# Patient Record
Sex: Female | Born: 1975 | Race: White | Hispanic: No | Marital: Married | Smoking: Never smoker
Health system: Southern US, Community
[De-identification: ages and names within clinical notes are randomized; demographics above are authoritative.]

---

## 2000-10-13 ENCOUNTER — Inpatient Hospital Stay (HOSPITAL_COMMUNITY): Admission: AD | Admit: 2000-10-13 | Discharge: 2000-10-16 | Payer: Self-pay | Admitting: Obstetrics and Gynecology

## 2000-10-18 ENCOUNTER — Encounter: Admission: RE | Admit: 2000-10-18 | Discharge: 2000-11-17 | Payer: Self-pay | Admitting: Obstetrics and Gynecology

## 2001-10-31 ENCOUNTER — Other Ambulatory Visit: Admission: RE | Admit: 2001-10-31 | Discharge: 2001-10-31 | Payer: Self-pay | Admitting: Obstetrics and Gynecology

## 2002-05-09 ENCOUNTER — Inpatient Hospital Stay (HOSPITAL_COMMUNITY): Admission: AD | Admit: 2002-05-09 | Discharge: 2002-05-11 | Payer: Self-pay | Admitting: Obstetrics and Gynecology

## 2004-07-17 ENCOUNTER — Other Ambulatory Visit: Admission: RE | Admit: 2004-07-17 | Discharge: 2004-07-17 | Payer: Self-pay | Admitting: Obstetrics and Gynecology

## 2005-03-14 ENCOUNTER — Inpatient Hospital Stay (HOSPITAL_COMMUNITY): Admission: AD | Admit: 2005-03-14 | Discharge: 2005-03-16 | Payer: Self-pay | Admitting: Obstetrics and Gynecology

## 2008-03-07 ENCOUNTER — Inpatient Hospital Stay (HOSPITAL_COMMUNITY): Admission: AD | Admit: 2008-03-07 | Discharge: 2008-03-07 | Payer: Self-pay | Admitting: Obstetrics and Gynecology

## 2008-03-17 ENCOUNTER — Inpatient Hospital Stay (HOSPITAL_COMMUNITY): Admission: AD | Admit: 2008-03-17 | Discharge: 2008-03-17 | Payer: Self-pay | Admitting: Obstetrics & Gynecology

## 2008-05-25 ENCOUNTER — Inpatient Hospital Stay (HOSPITAL_COMMUNITY): Admission: AD | Admit: 2008-05-25 | Discharge: 2008-05-27 | Payer: Self-pay | Admitting: Obstetrics & Gynecology

## 2008-05-25 ENCOUNTER — Encounter (INDEPENDENT_AMBULATORY_CARE_PROVIDER_SITE_OTHER): Payer: Self-pay | Admitting: Obstetrics and Gynecology

## 2010-05-20 LAB — CBC
HCT: 35.1 % — ABNORMAL LOW (ref 36.0–46.0)
Hemoglobin: 12 g/dL (ref 12.0–15.0)
MCHC: 34.2 g/dL (ref 30.0–36.0)
MCHC: 34.2 g/dL (ref 30.0–36.0)
MCV: 88.1 fL (ref 78.0–100.0)
Platelets: 233 10*3/uL (ref 150–400)
RBC: 3.32 MIL/uL — ABNORMAL LOW (ref 3.87–5.11)
RDW: 13.6 % (ref 11.5–15.5)

## 2010-05-25 LAB — FETAL FIBRONECTIN: Fetal Fibronectin: NEGATIVE

## 2010-05-25 LAB — URINALYSIS, ROUTINE W REFLEX MICROSCOPIC
Bilirubin Urine: NEGATIVE
Ketones, ur: 15 mg/dL — AB
Nitrite: NEGATIVE
pH: 5.5 (ref 5.0–8.0)

## 2010-05-25 LAB — URINE MICROSCOPIC-ADD ON

## 2010-05-26 LAB — BASIC METABOLIC PANEL
BUN: 5 mg/dL — ABNORMAL LOW (ref 6–23)
CO2: 23 mEq/L (ref 19–32)
Calcium: 8.8 mg/dL (ref 8.4–10.5)
Chloride: 106 mEq/L (ref 96–112)
Creatinine, Ser: 0.5 mg/dL (ref 0.4–1.2)
Glucose, Bld: 97 mg/dL (ref 70–99)

## 2010-05-26 LAB — URINALYSIS, ROUTINE W REFLEX MICROSCOPIC
Nitrite: NEGATIVE
Specific Gravity, Urine: 1.015 (ref 1.005–1.030)
Urobilinogen, UA: 0.2 mg/dL (ref 0.0–1.0)

## 2010-05-26 LAB — CBC
MCHC: 33.6 g/dL (ref 30.0–36.0)
MCV: 91.6 fL (ref 78.0–100.0)
RDW: 13.1 % (ref 11.5–15.5)

## 2010-05-26 LAB — URINE MICROSCOPIC-ADD ON

## 2010-05-26 LAB — FETAL FIBRONECTIN: Fetal Fibronectin: NEGATIVE

## 2010-05-26 LAB — RAPID STREP SCREEN (MED CTR MEBANE ONLY): Streptococcus, Group A Screen (Direct): NEGATIVE

## 2010-06-23 NOTE — Discharge Summary (Signed)
NAMESANTANNA, Sherry Reid                 ACCOUNT NO.:  [id number]   MEDICAL RECORD NO.:  [id number]          PATIENT TYPE:  INP   LOCATION:  9103                          FACILITY:  [facility]   PHYSICIAN:  Gerrit Friends. Aldona Bar, M.D.   DATE OF BIRTH:  09-22-1975   DATE OF ADMISSION:  05/25/2008  DATE OF DISCHARGE:  05/27/2008                               DISCHARGE SUMMARY   DISCHARGE DIAGNOSES:  1. A 37-week intrauterine pregnancy, delivered 7-pound female infant,      Apgars 8 and 9.  2. Blood type O positive.  3. Meconium-stained fluid.   PROCEDURE:  1. Normal spontaneous delivery.  2. Repair of first-degree tear.   SUMMARY:  This 35 year old, gravida 4, now para 4, was admitted at 53  and 34 weeks' gestation with ruptured membranes - meconium-stained  fluid.  Pregnancy was relatively benign.  She progressed fairly rapidly  and had a normal spontaneous delivery of viable female infant with Apgars  of 8 and 9 over first-degree tear which was repaired without difficulty.  Her postpartum course was uncomplicated.  Her discharge hemoglobin was  10 with a white count of 10,100 and platelet count of 233,000.  On the  morning of May 27, 2008, she was ambulating well, tolerating a regular  diet well, having normal bowel and bladder function, was afebrile.  Her  breast-feeding was going well and she was anxious for discharge.  Accordingly, she was given all appropriate instructions and understood  all instructions well.   Discharge medications include vitamins - 1 a day as long as she is  breastfeeding and Feosol capsules - 1 at least 4 times a week.   She will return to the office for followup in approximately 4 weeks'  time or as needed.  She was given a discharge brochure at the time of  discharge and understood all instructions well.   She will use Tylenol or Advil as needed for discomfort.   CONDITION ON DISCHARGE:  Improved.      Gerrit Friends. Aldona Bar, M.D.  Electronically Signed     RMW/MEDQ   D:  05/27/2008  T:  05/27/2008  Job:  [id number]

## 2010-06-26 NOTE — Discharge Summary (Signed)
Sherry Reid, Sherry Reid        ACCOUNT NO.:  [id number]   MEDICAL RECORD NO.:  [id number]          PATIENT TYPE:  INP   LOCATION:  9144                          FACILITY:  [facility]   PHYSICIAN:  Gerrit Friends. Aldona Bar, M.D.   DATE OF BIRTH:  09-Nov-1975   DATE OF ADMISSION:  03/14/2005  DATE OF DISCHARGE:  03/16/2005                                 DISCHARGE SUMMARY   DISCHARGE DIAGNOSES:  1.  Term pregnancy delivered 8 pounds 4 ounces female infant, Apgars 9 and      9.  .  2.  Blood type O+.   PROCEDURES:  1.  Normal spontaneous delivery.  2.  Second-degree tear repair,   HISTORY OF PRESENT ILLNESS:  This 35 year old gravida 3, para 2 with a due  date of March 07, 2005, was admitted in early labor at 40-[redacted] weeks  gestation after an uncomplicated pregnancy.  She progressed slowly but  steadily during her first stage but had a short second stage with subsequent  normal spontaneous delivery of viable female infant weighing 8 pounds 4  ounces with Apgar hours of 99 over a second-degree tear which was repaired  without difficulty.  Her postpartum course was benign.  Her discharge  hemoglobin was 10.3 with a white count of 17,500, platelet count of 304,000.  On the morning of March 16, 2005, she March 16, 2004, she was ambulating  well, tolerating a regular diet well, having normal bowel and bladder  function and was afebrile.  Her breast feeding was going well, and she was  desirous of discharge.  Accordingly, she was given all of appropriate  instructions and understood all instructions well.   DISCHARGE MEDICATIONS:  1.  Vitamins one a day as long she is breast-feeding.  2.  Feosol capsules one 3-4 times a week.  3.  Motrin 600 mg every 6 hours as needed for cramping.  4.  Tylox one to two every 4-6 hours as needed for more severe pain.   DISCHARGE INSTRUCTIONS:  1.  She will return the office followup in approximately four weeks' time or      as needed.  2.  She was given  instruction sheet at time of discharge and understood all      instructions well.   CONDITION ON DISCHARGE:  Improved.      Gerrit Friends. Aldona Bar, M.D.  Electronically Signed     RMW/MEDQ  D:  03/16/2005  T:  03/16/2005  Job:  [id number]

## 2010-12-25 IMAGING — US US RENAL
1 series · 14 of 25 positions shown · non-contrast
Comparison: None

CLINICAL DATA: Left-sided flank pain

RENAL/URINARY TRACT ULTRASOUND
TECHNIQUE: Complete ultrasound examination of the urinary tract
was performed including evaluation of the kidneys, renal collecting
systems, and urinary bladder.

[Series 1: us renal · 14 of 37 slices shown]
[im 1/37]
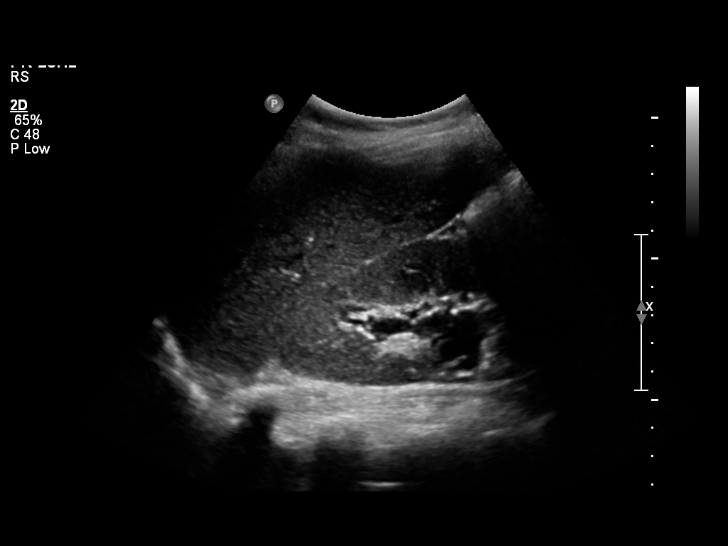
[im 4/37]
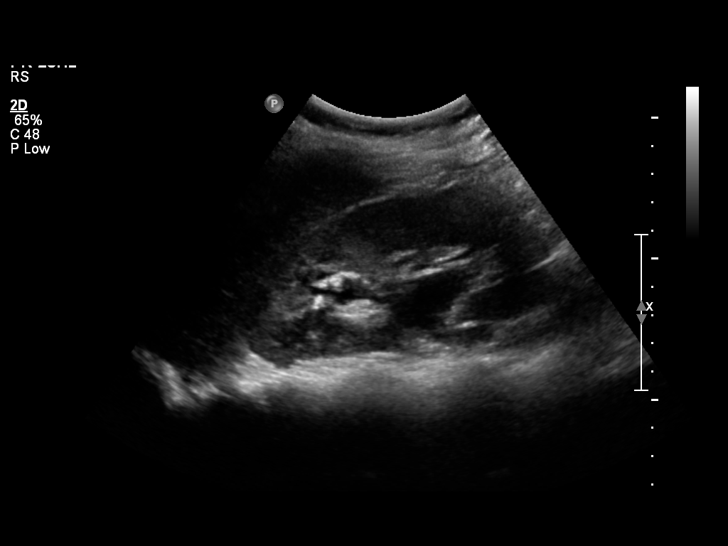
[im 7/37]
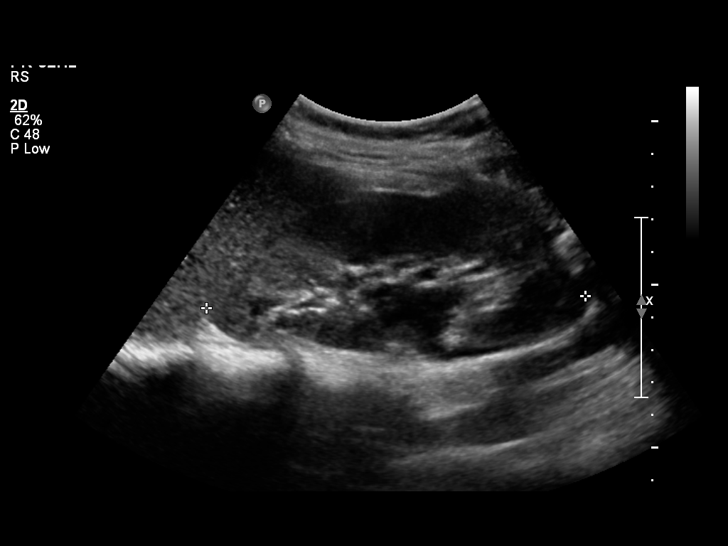
[im 10/37]
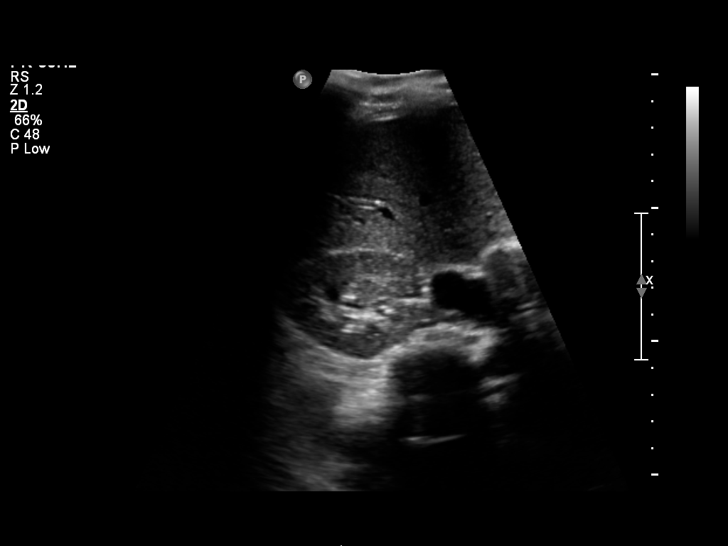
[im 13/37]
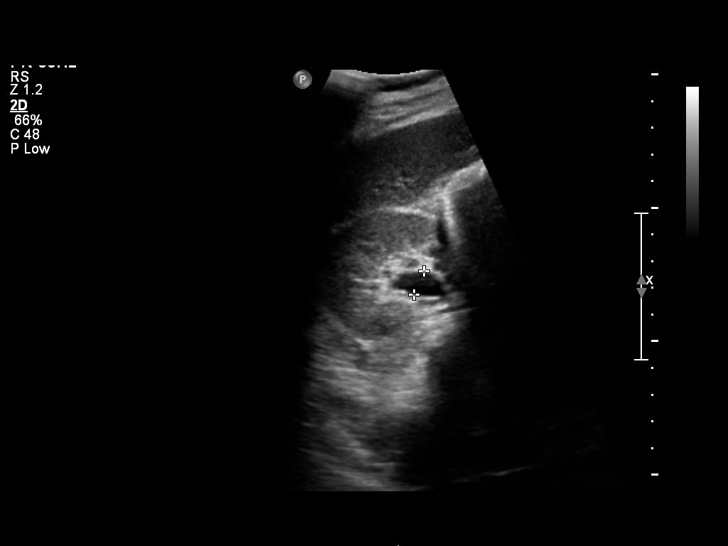
[im 14/37]
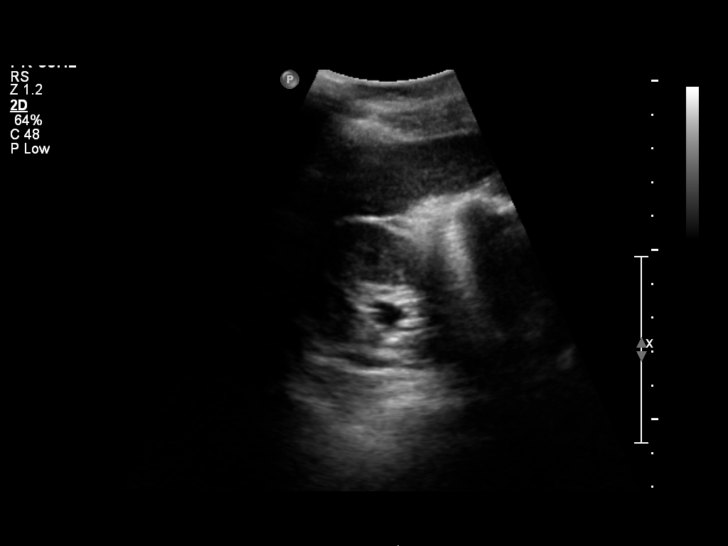
[im 17/37]
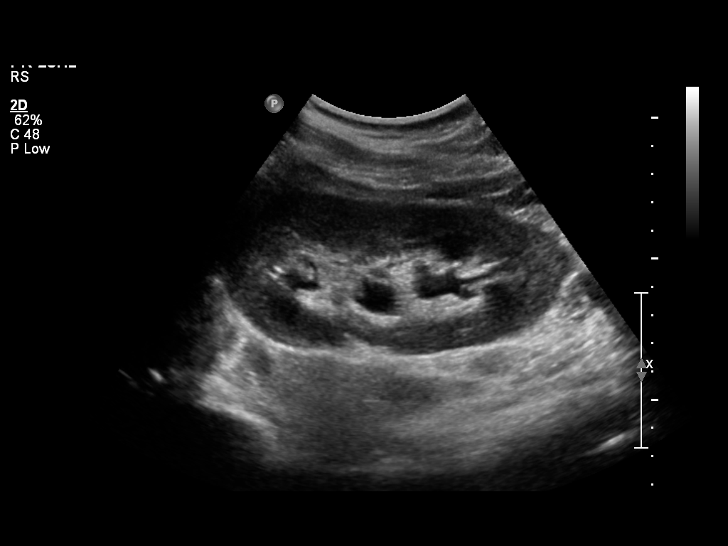
[im 20/37]
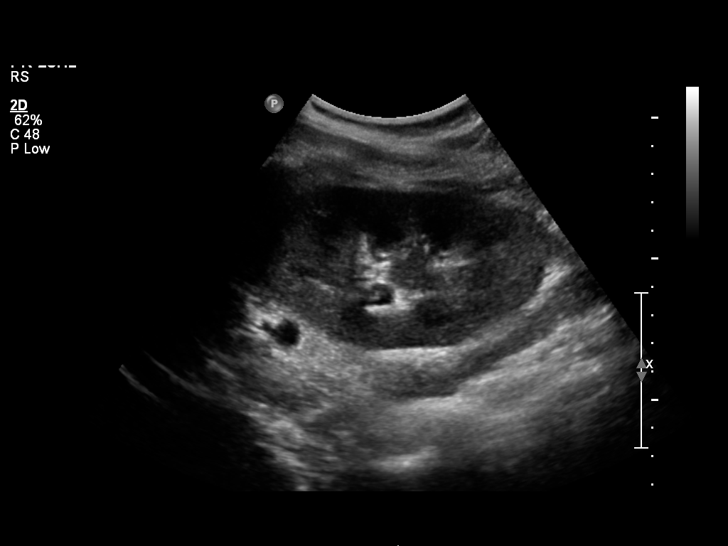
[im 23/37]
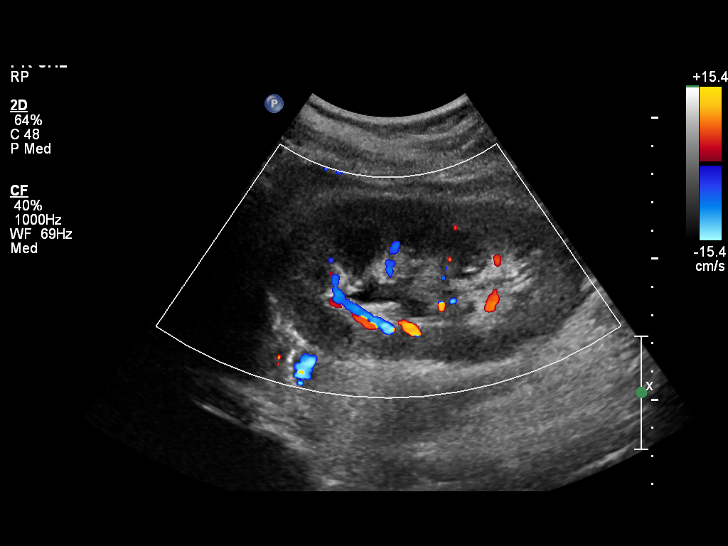
[im 25/37]
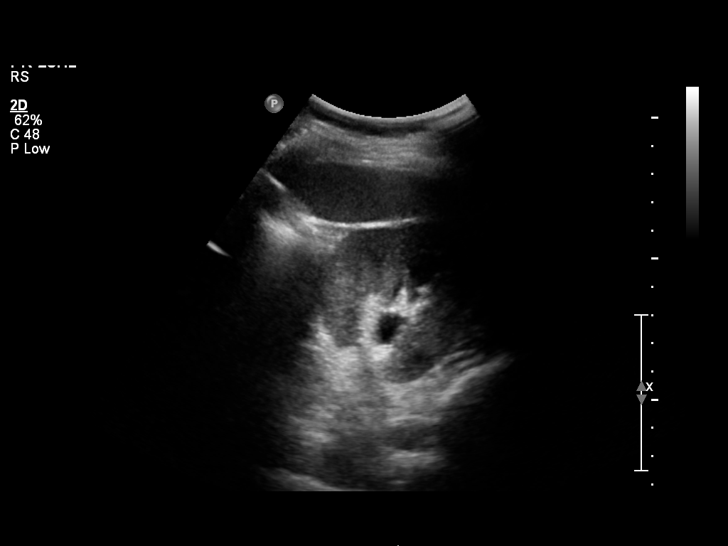
[im 28/37]
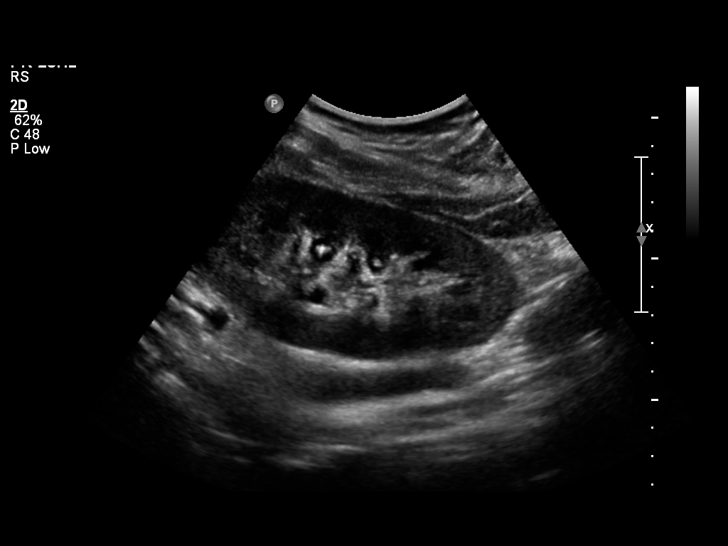
[im 31/37]
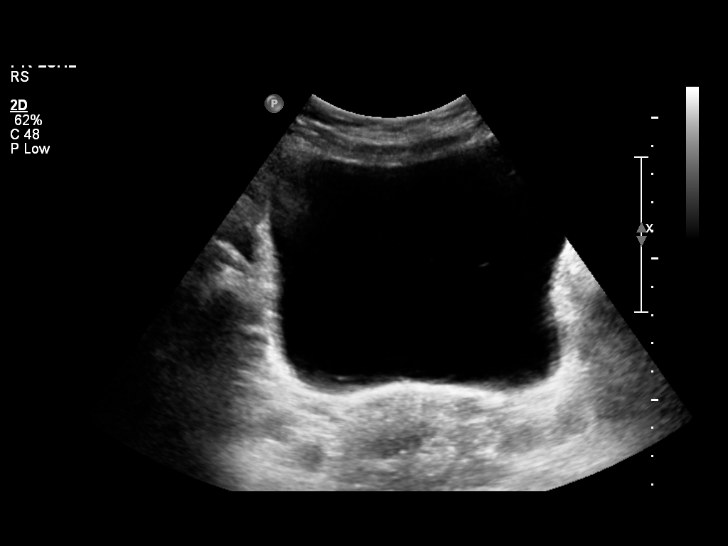
[im 34/37]
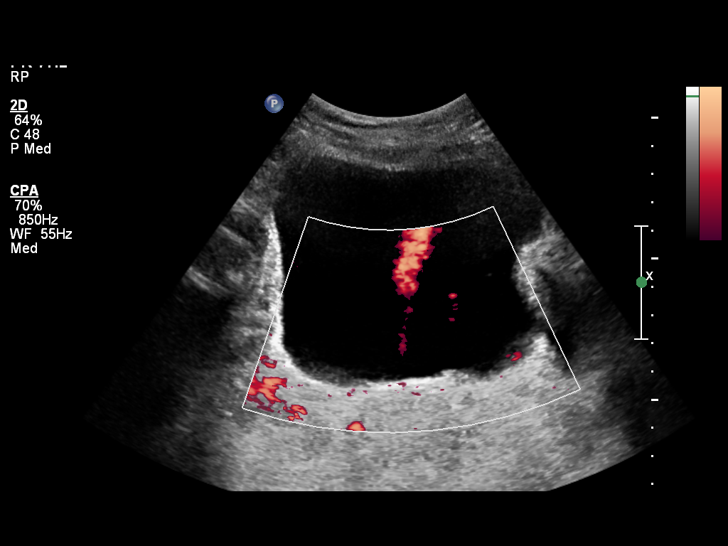
[im 37/37]
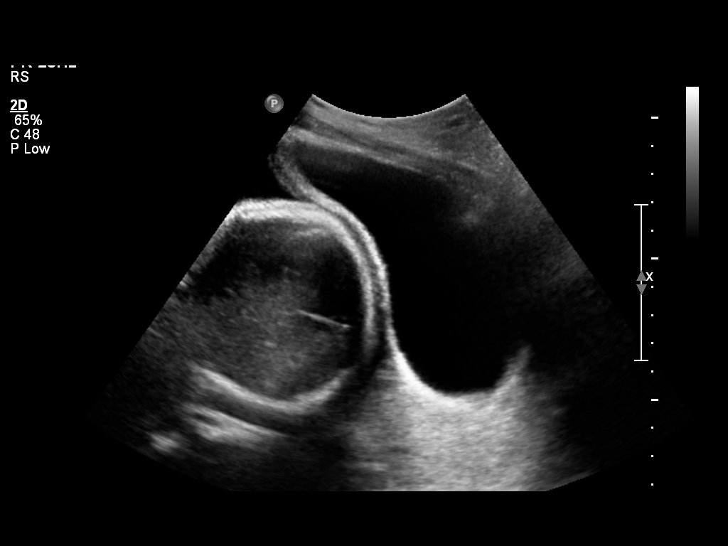

[14 of 25 positions shown; findings below may reference images not displayed]

FINDINGS: Right Kidney:  11.9 cm

Left kidney:  12.5 cm

Mild fullness of the bilateral intrarenal collecting systems is
noted, within normal limits for the patient's pregnant state.

Bilateral ureteral jets are visualized.  The bladder is
unremarkable.
IMPRESSION: Mild fullness of the bilateral intrarenal collecting systems,
within normal limits for the patient's pregnant state.  No acute
abnormality.  No evidence for obstruction.

## 2013-01-30 ENCOUNTER — Other Ambulatory Visit: Payer: Self-pay

## 2013-01-30 ENCOUNTER — Other Ambulatory Visit: Payer: Self-pay | Admitting: Allergy and Immunology

## 2013-01-30 DIAGNOSIS — R05 Cough: Secondary | ICD-10-CM

## 2016-08-30 ENCOUNTER — Encounter: Payer: Self-pay | Admitting: Vascular Surgery

## 2016-08-31 ENCOUNTER — Ambulatory Visit (INDEPENDENT_AMBULATORY_CARE_PROVIDER_SITE_OTHER): Payer: BLUE CROSS/BLUE SHIELD | Admitting: Vascular Surgery

## 2016-08-31 ENCOUNTER — Encounter: Payer: Self-pay | Admitting: Vascular Surgery

## 2016-08-31 VITALS — BP 119/79 | HR 75 | Temp 98.2°F | Resp 18 | Ht 62.0 in | Wt 106.0 lb

## 2016-08-31 DIAGNOSIS — I83891 Varicose veins of right lower extremities with other complications: Secondary | ICD-10-CM | POA: Insufficient documentation

## 2016-08-31 NOTE — Progress Notes (Signed)
Subjective:     Patient ID: Sherry Reid, female   DOB: 1975-04-11, 41 y.o.   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 She has been experiencing aching discomfort in the right posterior thigh particularly at night and she has a quite prominent vein in this area. She has no history of DVT thrombophlebitis stasis ulcers or bleeding. She does not experience swelling in the legs. She is not well elastic compression stockings or elevate her legs. The symptoms seem to have become more prominent recently. She has no symptoms in the left leg.  History reviewed. No pertinent past medical history.  Social History  Substance Use Topics  . Smoking status: Never Smoker  . Smokeless tobacco: Never Used  . Alcohol use Yes     Comment: very occassionally    History reviewed. No pertinent family history.  No Known Allergies   Current Outpatient Prescriptions:  .  diphenhydramine-acetaminophen (TYLENOL PM) 25-500 MG TABS tablet, Take 1 tablet by mouth at bedtime as needed., Disp: , Rfl:   Vitals:   08/31/16 1029  BP: 119/79  Pulse: 75  Resp: 18  Temp: 98.2 F (36.8 C)  TempSrc: Oral  SpO2: 99%  Weight: 106 lb (48.1 kg)  Height: 5\' 2"  (1.575 m)    Body mass index is 19.39 kg/m.         Review of Systems Completely negative review of systems in this healthy lady    Objective:   Physical Exam BP 119/79 (BP Location: Left Arm, Patient Position: Sitting, Cuff Size: Normal)   Pulse 75   Temp 98.2 F (36.8 C) (Oral)   Resp 18   Ht 5\' 2"  (1.575 m)   Wt 106 lb (48.1 kg)   SpO2 99%   BMI 19.39 kg/m     Gen.-alert and oriented x3 in no apparent distress HEENT normal for age Lungs no rhonchi or wheezing Cardiovascular regular rhythm no murmurs carotid pulses 3+ palpable no bruits audible Abdomen soft nontender no palpable masses Musculoskeletal free of  major deformities Skin  clear -no rashes Neurologic normal Lower extremities 3+ femoral and dorsalis pedis pulses palpable bilaterally with no edema Prominent superficial reticular vein in the right posterior thigh extending up towards medial buttock cheek and toward lateral distal thigh. No bulging varicosities noted. No hyperpigmentation or ulceration in right leg.  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

## 2016-10-18 ENCOUNTER — Encounter: Payer: Self-pay | Admitting: *Deleted

## 2016-10-20 ENCOUNTER — Ambulatory Visit: Payer: BLUE CROSS/BLUE SHIELD | Admitting: *Deleted

## 2016-12-08 ENCOUNTER — Ambulatory Visit: Payer: BLUE CROSS/BLUE SHIELD | Admitting: *Deleted

## 2017-02-02 ENCOUNTER — Other Ambulatory Visit: Payer: Self-pay | Admitting: Orthopedic Surgery

## 2017-02-02 DIAGNOSIS — M2392 Unspecified internal derangement of left knee: Secondary | ICD-10-CM

## 2020-03-10 ENCOUNTER — Encounter (HOSPITAL_BASED_OUTPATIENT_CLINIC_OR_DEPARTMENT_OTHER): Payer: Self-pay | Admitting: *Deleted

## 2020-03-10 ENCOUNTER — Other Ambulatory Visit: Payer: Self-pay

## 2020-03-10 ENCOUNTER — Emergency Department (HOSPITAL_BASED_OUTPATIENT_CLINIC_OR_DEPARTMENT_OTHER)
Admission: EM | Admit: 2020-03-10 | Discharge: 2020-03-10 | Disposition: A | Discharge status: Home / Self Care | Payer: BLUE CROSS/BLUE SHIELD | Attending: Emergency Medicine | Admitting: Emergency Medicine

## 2020-03-10 DIAGNOSIS — Z5321 Procedure and treatment not carried out due to patient leaving prior to being seen by health care provider: Secondary | ICD-10-CM | POA: Diagnosis not present

## 2020-03-10 DIAGNOSIS — R102 Pelvic and perineal pain: Secondary | ICD-10-CM | POA: Insufficient documentation

## 2020-03-10 NOTE — ED Triage Notes (Addendum)
C/o bil lower pelvic pain x 2  Weeks , seen by ED and OBGYN for same DX ovarian cyst, CT , [place] and labs done
